# Patient Record
Sex: Female | Born: 1960
Health system: Southern US, Community
[De-identification: ages and names within clinical notes are randomized; demographics above are authoritative.]

---

## 1999-11-19 ENCOUNTER — Other Ambulatory Visit: Admission: RE | Admit: 1999-11-19 | Discharge: 1999-11-19 | Payer: Self-pay | Admitting: *Deleted

## 1999-12-01 ENCOUNTER — Encounter: Payer: Self-pay | Admitting: *Deleted

## 1999-12-01 ENCOUNTER — Ambulatory Visit (HOSPITAL_COMMUNITY): Admission: RE | Admit: 1999-12-01 | Discharge: 1999-12-01 | Payer: Self-pay | Admitting: *Deleted

## 2001-02-22 ENCOUNTER — Other Ambulatory Visit: Admission: RE | Admit: 2001-02-22 | Discharge: 2001-02-22 | Payer: Self-pay | Admitting: *Deleted

## 2004-07-30 ENCOUNTER — Ambulatory Visit (HOSPITAL_COMMUNITY): Admission: RE | Admit: 2004-07-30 | Discharge: 2004-07-30 | Payer: Self-pay | Admitting: Family Medicine

## 2004-08-10 ENCOUNTER — Encounter: Admission: RE | Admit: 2004-08-10 | Discharge: 2004-08-10 | Payer: Self-pay | Admitting: Family Medicine

## 2005-10-21 ENCOUNTER — Encounter: Admission: RE | Admit: 2005-10-21 | Discharge: 2005-10-21 | Payer: Self-pay | Admitting: Family Medicine

## 2005-10-31 ENCOUNTER — Encounter: Admission: RE | Admit: 2005-10-31 | Discharge: 2005-10-31 | Payer: Self-pay | Admitting: Family Medicine

## 2006-09-04 ENCOUNTER — Other Ambulatory Visit: Admission: RE | Admit: 2006-09-04 | Discharge: 2006-09-04 | Payer: Self-pay | Admitting: Family Medicine

## 2006-11-23 ENCOUNTER — Encounter: Admission: RE | Admit: 2006-11-23 | Discharge: 2006-11-23 | Payer: Self-pay | Admitting: Family Medicine

## 2008-01-04 ENCOUNTER — Encounter: Admission: RE | Admit: 2008-01-04 | Discharge: 2008-01-04 | Payer: Self-pay | Admitting: Family Medicine

## 2009-01-16 ENCOUNTER — Encounter: Admission: RE | Admit: 2009-01-16 | Discharge: 2009-01-16 | Payer: Self-pay | Admitting: Family Medicine

## 2010-04-13 ENCOUNTER — Other Ambulatory Visit: Payer: Self-pay | Admitting: Family Medicine

## 2010-04-13 DIAGNOSIS — Z1231 Encounter for screening mammogram for malignant neoplasm of breast: Secondary | ICD-10-CM

## 2010-04-13 DIAGNOSIS — Z1239 Encounter for other screening for malignant neoplasm of breast: Secondary | ICD-10-CM

## 2010-04-21 ENCOUNTER — Ambulatory Visit
Admission: RE | Admit: 2010-04-21 | Discharge: 2010-04-21 | Disposition: A | Discharge status: Home / Self Care | Payer: BC Managed Care – PPO | Source: Ambulatory Visit | Attending: Family Medicine | Admitting: Family Medicine

## 2010-04-21 DIAGNOSIS — Z1231 Encounter for screening mammogram for malignant neoplasm of breast: Secondary | ICD-10-CM

## 2011-04-12 ENCOUNTER — Other Ambulatory Visit: Payer: Self-pay | Admitting: Family Medicine

## 2011-04-12 DIAGNOSIS — Z1231 Encounter for screening mammogram for malignant neoplasm of breast: Secondary | ICD-10-CM

## 2011-04-26 ENCOUNTER — Ambulatory Visit
Admission: RE | Admit: 2011-04-26 | Discharge: 2011-04-26 | Disposition: A | Discharge status: Home / Self Care | Payer: 59 | Source: Ambulatory Visit | Attending: Family Medicine | Admitting: Family Medicine

## 2011-04-26 DIAGNOSIS — Z1231 Encounter for screening mammogram for malignant neoplasm of breast: Secondary | ICD-10-CM

## 2011-05-24 ENCOUNTER — Other Ambulatory Visit (HOSPITAL_COMMUNITY)
Admission: RE | Admit: 2011-05-24 | Discharge: 2011-05-24 | Disposition: A | Discharge status: Home / Self Care | Payer: 59 | Source: Ambulatory Visit | Attending: Family Medicine | Admitting: Family Medicine

## 2011-05-24 ENCOUNTER — Other Ambulatory Visit: Payer: Self-pay | Admitting: Physician Assistant

## 2011-05-24 DIAGNOSIS — Z124 Encounter for screening for malignant neoplasm of cervix: Secondary | ICD-10-CM | POA: Insufficient documentation

## 2012-05-28 ENCOUNTER — Other Ambulatory Visit: Payer: Self-pay | Admitting: Physician Assistant

## 2012-05-28 ENCOUNTER — Other Ambulatory Visit (HOSPITAL_COMMUNITY)
Admission: RE | Admit: 2012-05-28 | Discharge: 2012-05-28 | Disposition: A | Discharge status: Home / Self Care | Payer: 59 | Source: Ambulatory Visit | Attending: Family Medicine | Admitting: Family Medicine

## 2012-05-28 DIAGNOSIS — Z124 Encounter for screening for malignant neoplasm of cervix: Secondary | ICD-10-CM | POA: Insufficient documentation

## 2012-06-04 ENCOUNTER — Other Ambulatory Visit: Payer: Self-pay

## 2012-06-04 DIAGNOSIS — Z1231 Encounter for screening mammogram for malignant neoplasm of breast: Secondary | ICD-10-CM

## 2012-07-02 ENCOUNTER — Other Ambulatory Visit: Payer: Self-pay | Admitting: Family Medicine

## 2012-07-02 ENCOUNTER — Ambulatory Visit
Admission: RE | Admit: 2012-07-02 | Discharge: 2012-07-02 | Disposition: A | Discharge status: Home / Self Care | Payer: 59 | Source: Ambulatory Visit

## 2012-07-02 DIAGNOSIS — R928 Other abnormal and inconclusive findings on diagnostic imaging of breast: Secondary | ICD-10-CM

## 2012-07-02 DIAGNOSIS — Z1231 Encounter for screening mammogram for malignant neoplasm of breast: Secondary | ICD-10-CM

## 2012-07-13 ENCOUNTER — Ambulatory Visit
Admission: RE | Admit: 2012-07-13 | Discharge: 2012-07-13 | Disposition: A | Discharge status: Home / Self Care | Payer: 59 | Source: Ambulatory Visit | Attending: Family Medicine | Admitting: Family Medicine

## 2012-07-13 DIAGNOSIS — R928 Other abnormal and inconclusive findings on diagnostic imaging of breast: Secondary | ICD-10-CM

## 2012-12-24 ENCOUNTER — Other Ambulatory Visit: Payer: Self-pay | Admitting: Family Medicine

## 2012-12-24 DIAGNOSIS — N6310 Unspecified lump in the right breast, unspecified quadrant: Secondary | ICD-10-CM

## 2013-01-14 ENCOUNTER — Ambulatory Visit
Admission: RE | Admit: 2013-01-14 | Discharge: 2013-01-14 | Disposition: A | Discharge status: Home / Self Care | Payer: 59 | Source: Ambulatory Visit | Attending: Family Medicine | Admitting: Family Medicine

## 2013-01-14 DIAGNOSIS — N6310 Unspecified lump in the right breast, unspecified quadrant: Secondary | ICD-10-CM

## 2013-05-29 ENCOUNTER — Other Ambulatory Visit: Payer: Self-pay | Admitting: Physician Assistant

## 2013-05-29 ENCOUNTER — Other Ambulatory Visit (HOSPITAL_COMMUNITY)
Admission: RE | Admit: 2013-05-29 | Discharge: 2013-05-29 | Disposition: A | Discharge status: Home / Self Care | Payer: 59 | Source: Ambulatory Visit | Attending: Family Medicine | Admitting: Family Medicine

## 2013-05-29 DIAGNOSIS — Z124 Encounter for screening for malignant neoplasm of cervix: Secondary | ICD-10-CM | POA: Insufficient documentation

## 2013-08-15 ENCOUNTER — Other Ambulatory Visit: Payer: Self-pay | Admitting: Family Medicine

## 2013-08-15 DIAGNOSIS — N6489 Other specified disorders of breast: Secondary | ICD-10-CM

## 2013-08-27 ENCOUNTER — Ambulatory Visit
Admission: RE | Admit: 2013-08-27 | Discharge: 2013-08-27 | Disposition: A | Discharge status: Home / Self Care | Payer: 59 | Source: Ambulatory Visit | Attending: Family Medicine | Admitting: Family Medicine

## 2013-08-27 ENCOUNTER — Encounter (INDEPENDENT_AMBULATORY_CARE_PROVIDER_SITE_OTHER): Payer: Self-pay

## 2013-08-27 DIAGNOSIS — N6489 Other specified disorders of breast: Secondary | ICD-10-CM

## 2015-01-29 ENCOUNTER — Other Ambulatory Visit: Payer: Self-pay

## 2015-01-29 DIAGNOSIS — Z1231 Encounter for screening mammogram for malignant neoplasm of breast: Secondary | ICD-10-CM

## 2015-03-05 ENCOUNTER — Ambulatory Visit
Admission: RE | Admit: 2015-03-05 | Discharge: 2015-03-05 | Disposition: A | Discharge status: Home / Self Care | Payer: 59 | Source: Ambulatory Visit

## 2015-03-05 DIAGNOSIS — Z1231 Encounter for screening mammogram for malignant neoplasm of breast: Secondary | ICD-10-CM

## 2015-03-11 ENCOUNTER — Other Ambulatory Visit: Payer: Self-pay | Admitting: Family Medicine

## 2015-03-11 DIAGNOSIS — R928 Other abnormal and inconclusive findings on diagnostic imaging of breast: Secondary | ICD-10-CM

## 2015-03-18 ENCOUNTER — Ambulatory Visit
Admission: RE | Admit: 2015-03-18 | Discharge: 2015-03-18 | Disposition: A | Discharge status: Home / Self Care | Payer: 59 | Source: Ambulatory Visit | Attending: Family Medicine | Admitting: Family Medicine

## 2015-03-18 ENCOUNTER — Other Ambulatory Visit: Payer: Self-pay | Admitting: Family Medicine

## 2015-03-18 DIAGNOSIS — R928 Other abnormal and inconclusive findings on diagnostic imaging of breast: Secondary | ICD-10-CM

## 2015-06-03 ENCOUNTER — Other Ambulatory Visit (HOSPITAL_COMMUNITY)
Admission: RE | Admit: 2015-06-03 | Discharge: 2015-06-03 | Disposition: A | Discharge status: Home / Self Care | Payer: 59 | Source: Ambulatory Visit | Attending: Family Medicine | Admitting: Family Medicine

## 2015-06-03 ENCOUNTER — Other Ambulatory Visit: Payer: Self-pay | Admitting: Physician Assistant

## 2015-06-03 DIAGNOSIS — Z124 Encounter for screening for malignant neoplasm of cervix: Secondary | ICD-10-CM | POA: Insufficient documentation

## 2015-06-04 LAB — CYTOLOGY - PAP

## 2016-04-21 ENCOUNTER — Other Ambulatory Visit: Payer: Self-pay | Admitting: Physician Assistant

## 2016-04-21 DIAGNOSIS — Z1231 Encounter for screening mammogram for malignant neoplasm of breast: Secondary | ICD-10-CM

## 2016-04-28 ENCOUNTER — Ambulatory Visit
Admission: RE | Admit: 2016-04-28 | Discharge: 2016-04-28 | Disposition: A | Discharge status: Home / Self Care | Payer: 59 | Source: Ambulatory Visit | Attending: Physician Assistant | Admitting: Physician Assistant

## 2016-04-28 DIAGNOSIS — Z1231 Encounter for screening mammogram for malignant neoplasm of breast: Secondary | ICD-10-CM | POA: Diagnosis not present

## 2016-06-08 DIAGNOSIS — Z23 Encounter for immunization: Secondary | ICD-10-CM | POA: Diagnosis not present

## 2016-06-08 DIAGNOSIS — Z0000 Encounter for general adult medical examination without abnormal findings: Secondary | ICD-10-CM | POA: Diagnosis not present

## 2017-05-05 ENCOUNTER — Other Ambulatory Visit: Payer: Self-pay | Admitting: Physician Assistant

## 2017-05-05 DIAGNOSIS — Z139 Encounter for screening, unspecified: Secondary | ICD-10-CM

## 2017-05-24 ENCOUNTER — Ambulatory Visit
Admission: RE | Admit: 2017-05-24 | Discharge: 2017-05-24 | Disposition: A | Discharge status: Home / Self Care | Payer: 59 | Source: Ambulatory Visit | Attending: Physician Assistant | Admitting: Physician Assistant

## 2017-05-24 DIAGNOSIS — Z1231 Encounter for screening mammogram for malignant neoplasm of breast: Secondary | ICD-10-CM | POA: Diagnosis not present

## 2017-05-24 DIAGNOSIS — Z139 Encounter for screening, unspecified: Secondary | ICD-10-CM

## 2017-06-12 DIAGNOSIS — Z0000 Encounter for general adult medical examination without abnormal findings: Secondary | ICD-10-CM | POA: Diagnosis not present

## 2017-06-12 DIAGNOSIS — Z13220 Encounter for screening for lipoid disorders: Secondary | ICD-10-CM | POA: Diagnosis not present

## 2019-05-15 ENCOUNTER — Other Ambulatory Visit: Payer: Self-pay | Admitting: Physician Assistant

## 2019-05-15 ENCOUNTER — Other Ambulatory Visit (HOSPITAL_COMMUNITY)
Admission: RE | Admit: 2019-05-15 | Discharge: 2019-05-15 | Disposition: A | Discharge status: Home / Self Care | Payer: 59 | Source: Ambulatory Visit | Attending: Physician Assistant | Admitting: Physician Assistant

## 2019-05-15 DIAGNOSIS — Z124 Encounter for screening for malignant neoplasm of cervix: Secondary | ICD-10-CM | POA: Insufficient documentation

## 2019-05-16 LAB — CYTOLOGY - PAP: Diagnosis: NEGATIVE

## 2020-03-23 ENCOUNTER — Other Ambulatory Visit: Payer: Self-pay | Admitting: Physician Assistant

## 2020-03-23 DIAGNOSIS — Z1231 Encounter for screening mammogram for malignant neoplasm of breast: Secondary | ICD-10-CM

## 2020-03-25 ENCOUNTER — Other Ambulatory Visit: Payer: Self-pay

## 2020-03-25 ENCOUNTER — Ambulatory Visit
Admission: RE | Admit: 2020-03-25 | Discharge: 2020-03-25 | Disposition: A | Discharge status: Home / Self Care | Payer: 59 | Source: Ambulatory Visit | Attending: Physician Assistant | Admitting: Physician Assistant

## 2020-03-25 DIAGNOSIS — Z1231 Encounter for screening mammogram for malignant neoplasm of breast: Secondary | ICD-10-CM

## 2020-06-01 ENCOUNTER — Encounter: Payer: Self-pay | Admitting: Gastroenterology

## 2020-07-21 ENCOUNTER — Encounter: Payer: 59 | Admitting: Gastroenterology

## 2021-04-30 ENCOUNTER — Other Ambulatory Visit: Payer: Self-pay | Admitting: Physician Assistant

## 2021-04-30 DIAGNOSIS — Z1231 Encounter for screening mammogram for malignant neoplasm of breast: Secondary | ICD-10-CM

## 2021-05-03 ENCOUNTER — Ambulatory Visit
Admission: RE | Admit: 2021-05-03 | Discharge: 2021-05-03 | Disposition: A | Discharge status: Home / Self Care | Payer: 59 | Source: Ambulatory Visit | Attending: Physician Assistant | Admitting: Physician Assistant

## 2021-05-03 DIAGNOSIS — Z1231 Encounter for screening mammogram for malignant neoplasm of breast: Secondary | ICD-10-CM

## 2021-05-05 ENCOUNTER — Other Ambulatory Visit: Payer: Self-pay | Admitting: Physician Assistant

## 2021-05-05 DIAGNOSIS — R928 Other abnormal and inconclusive findings on diagnostic imaging of breast: Secondary | ICD-10-CM

## 2021-05-17 ENCOUNTER — Ambulatory Visit
Admission: RE | Admit: 2021-05-17 | Discharge: 2021-05-17 | Disposition: A | Discharge status: Home / Self Care | Payer: 59 | Source: Ambulatory Visit | Attending: Physician Assistant | Admitting: Physician Assistant

## 2021-05-17 ENCOUNTER — Ambulatory Visit: Payer: 59

## 2021-05-17 DIAGNOSIS — R928 Other abnormal and inconclusive findings on diagnostic imaging of breast: Secondary | ICD-10-CM

## 2022-01-25 ENCOUNTER — Ambulatory Visit
Admission: RE | Admit: 2022-01-25 | Discharge: 2022-01-25 | Disposition: A | Discharge status: Home / Self Care | Payer: 59 | Source: Ambulatory Visit | Attending: Physician Assistant | Admitting: Physician Assistant

## 2022-01-25 ENCOUNTER — Other Ambulatory Visit: Payer: Self-pay | Admitting: Physician Assistant

## 2022-01-25 DIAGNOSIS — S6992XA Unspecified injury of left wrist, hand and finger(s), initial encounter: Secondary | ICD-10-CM

## 2022-03-31 ENCOUNTER — Other Ambulatory Visit: Payer: Self-pay | Admitting: Physician Assistant

## 2022-03-31 DIAGNOSIS — Z1231 Encounter for screening mammogram for malignant neoplasm of breast: Secondary | ICD-10-CM

## 2022-05-19 ENCOUNTER — Ambulatory Visit
Admission: RE | Admit: 2022-05-19 | Discharge: 2022-05-19 | Disposition: A | Discharge status: Home / Self Care | Payer: 59 | Source: Ambulatory Visit | Attending: Physician Assistant | Admitting: Physician Assistant

## 2022-05-19 DIAGNOSIS — Z1231 Encounter for screening mammogram for malignant neoplasm of breast: Secondary | ICD-10-CM

## 2022-12-12 IMAGING — MG MM DIGITAL DIAGNOSTIC UNILAT*R* W/ TOMO W/ CAD
4 series · 4 of 12 positions shown · non-contrast
Comparison: Previous exam(s).

CLINICAL DATA: Screening recall for a possible right breast
architectural distortion.

EXAM:
DIGITAL DIAGNOSTIC UNILATERAL RIGHT MAMMOGRAM WITH TOMOSYNTHESIS AND
CAD
TECHNIQUE: Right digital diagnostic mammography and breast tomosynthesis was
performed. The images were evaluated with computer-aided detection.

[R ML synth-2D]
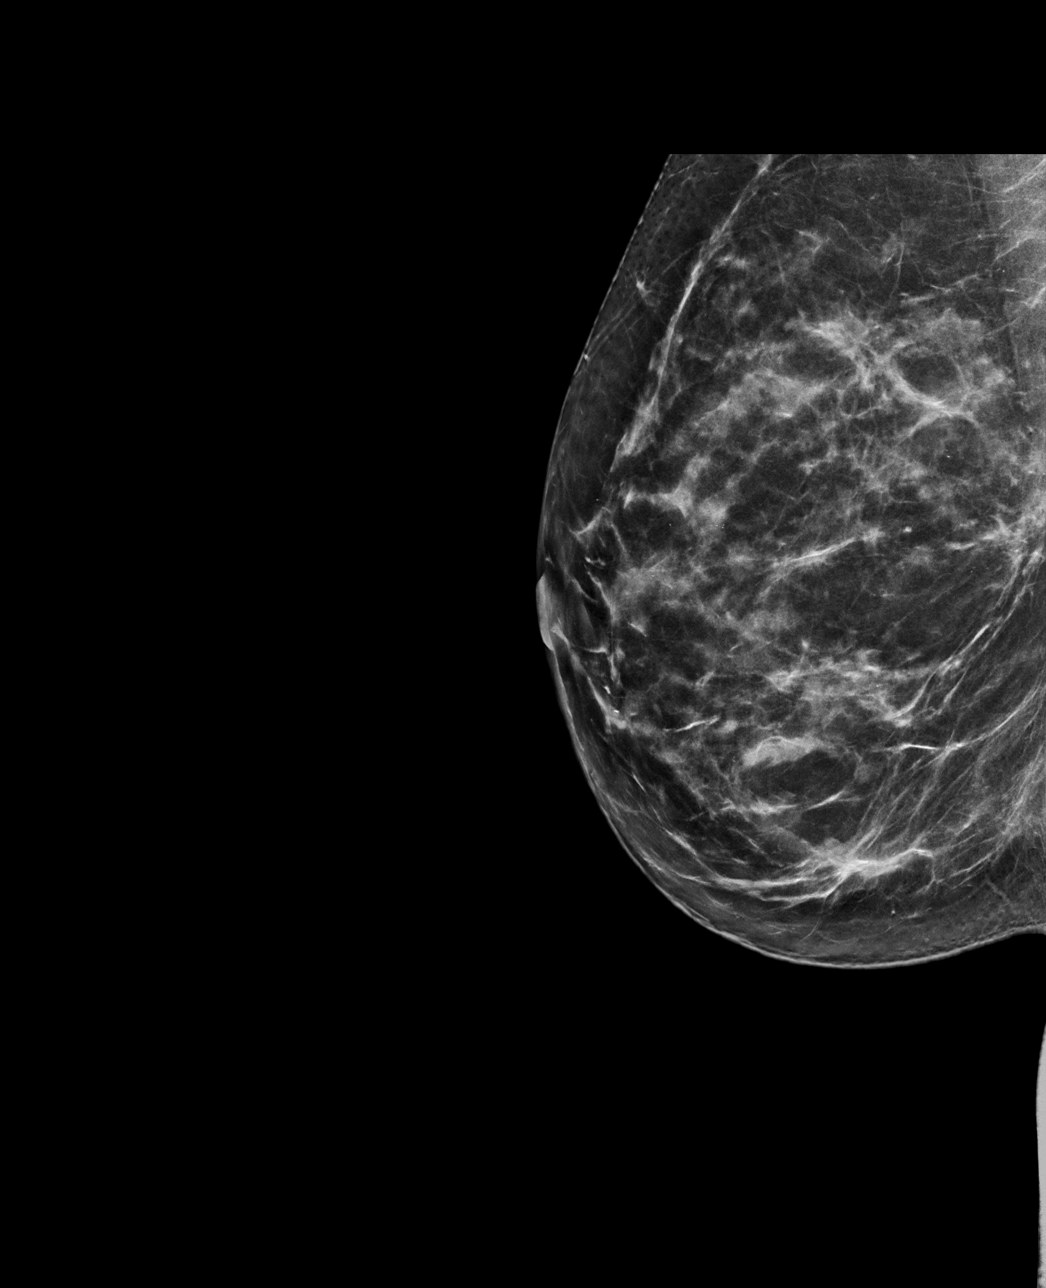

[R MLO synth-2D]
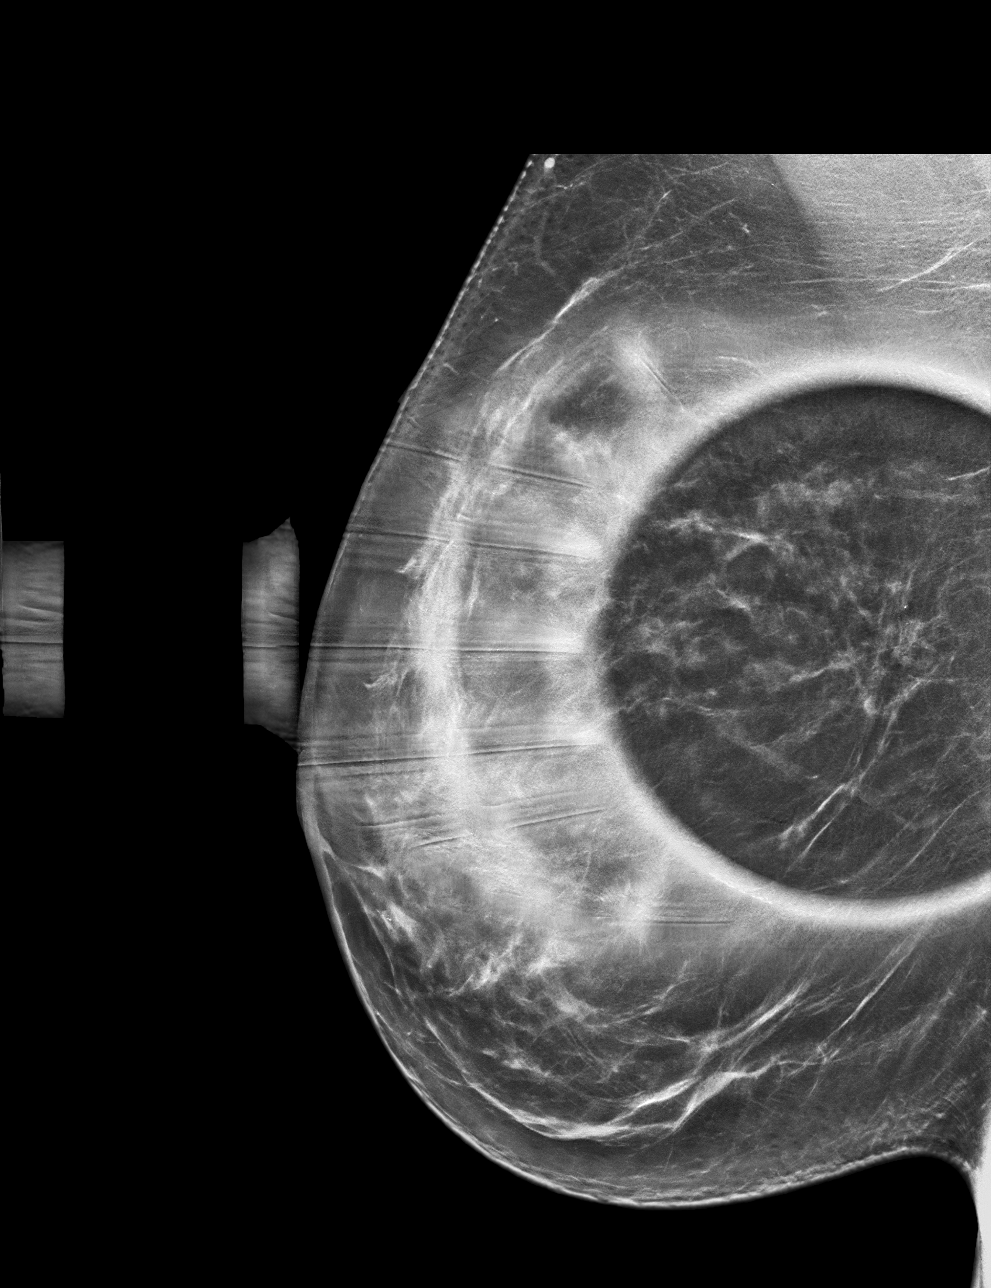

[R ML tomo · tomo slice 35/70.0]
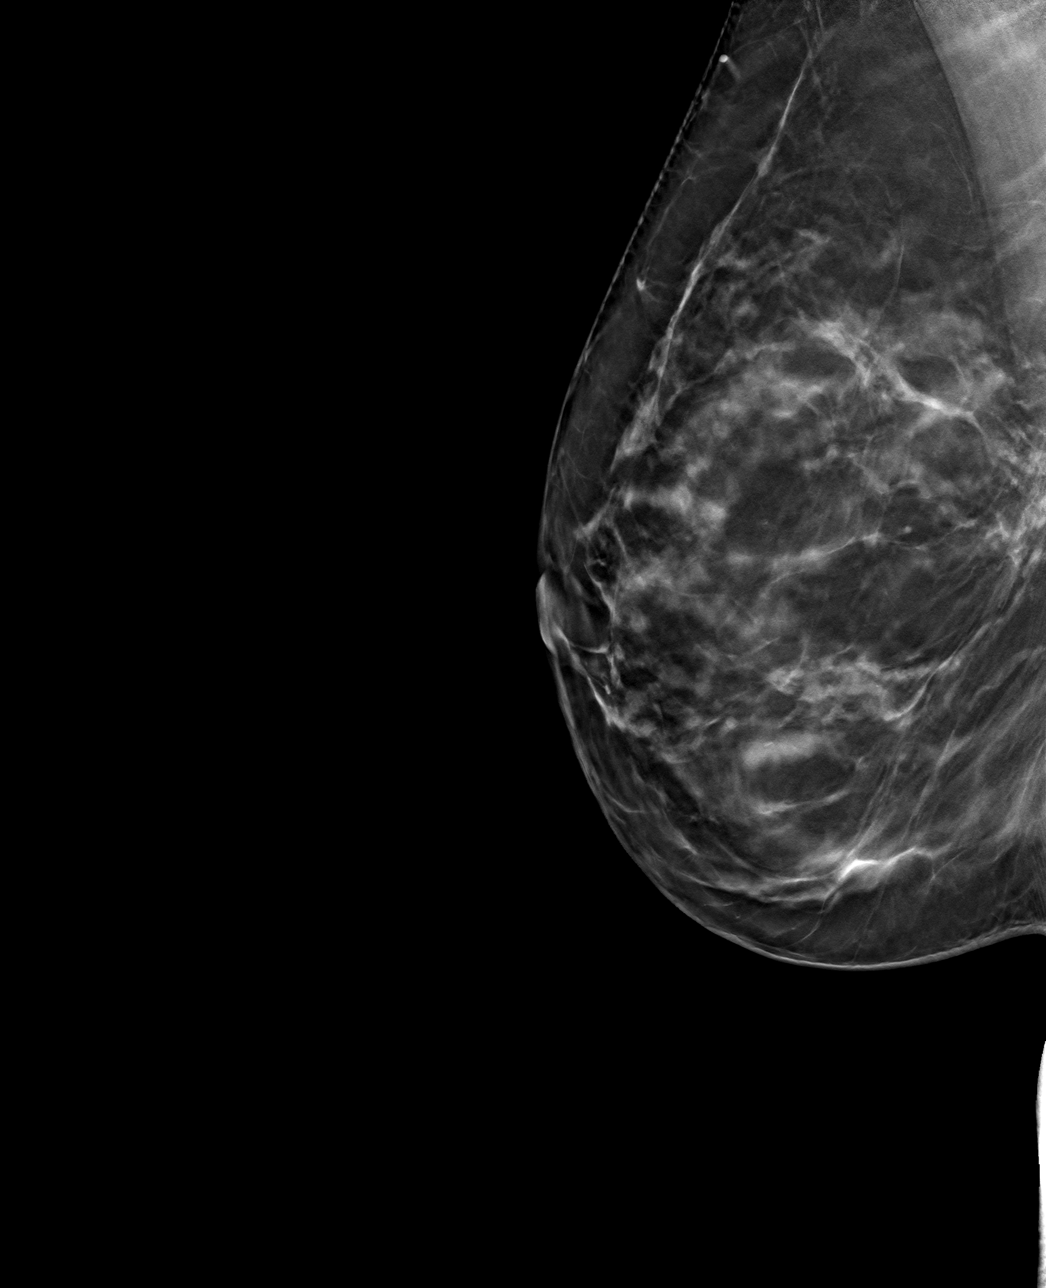

[R MLO tomo · tomo slice 35/69.0]
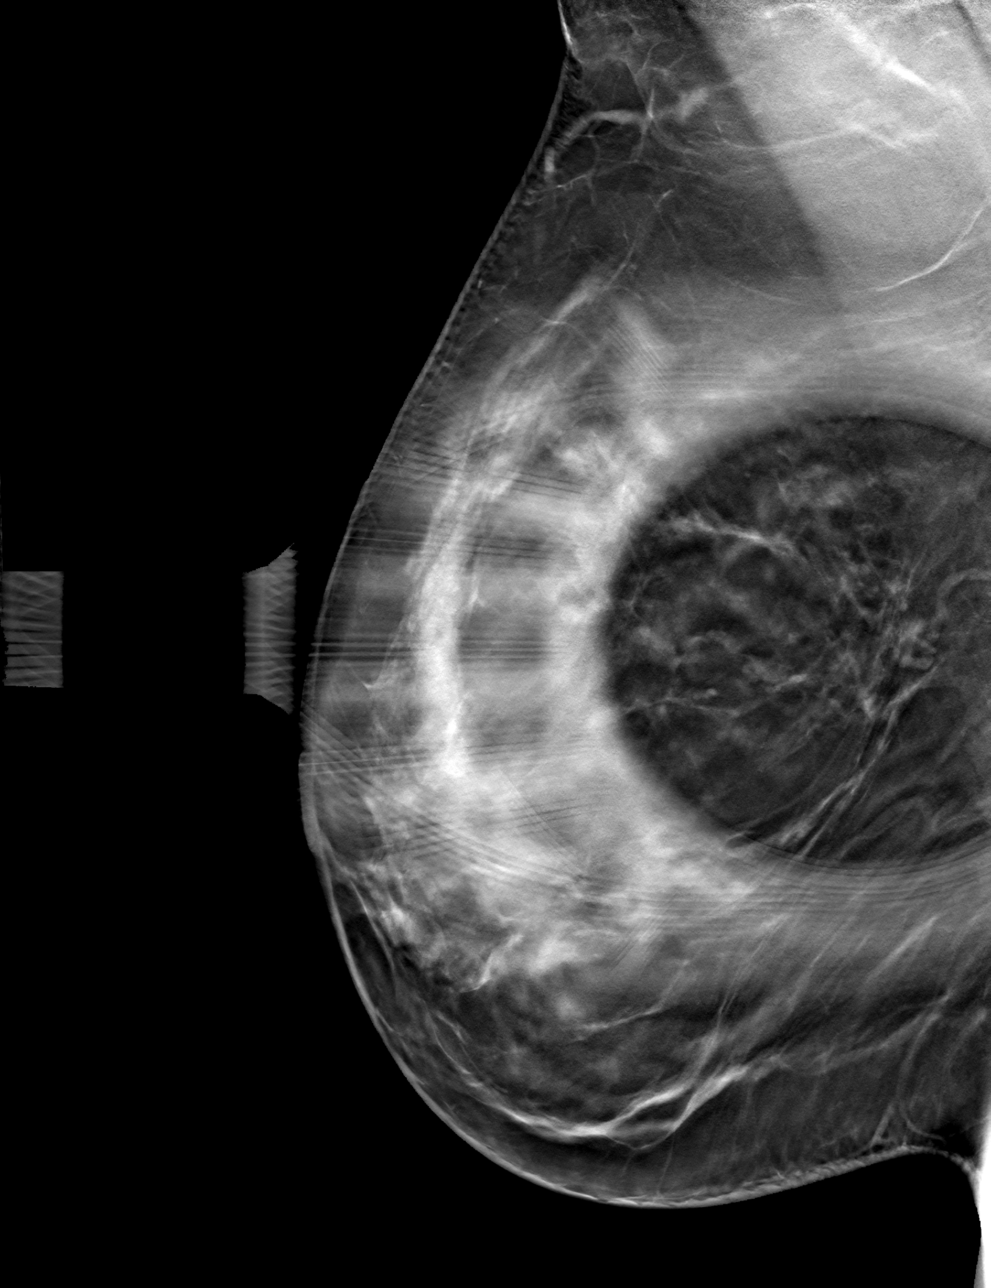

[4 of 12 positions shown; findings below may reference images not displayed]

ACR Breast Density Category c: The breast tissue is heterogeneously
dense, which may obscure small masses.
FINDINGS: The possible architectural distortion, seen in the posterior right
breast on the screening MLO view, partly disperses on spot
compression imaging consistent with fibroglandular tissue. The
appearance on spot compression imaging is also similar to multiple
prior exams. There is no true architectural distortion and no
underlying mass. No suspicious calcifications.
IMPRESSION: No evidence of breast malignancy.

RECOMMENDATION:
Screening mammogram in one year.(Code:OG-[id number])

I have discussed the findings and recommendations with the patient.
If applicable, a reminder letter will be sent to the patient
regarding the next appointment.

BI-RADS CATEGORY  1: Negative.

## 2023-05-26 DIAGNOSIS — E7800 Pure hypercholesterolemia, unspecified: Secondary | ICD-10-CM | POA: Diagnosis not present

## 2023-05-26 DIAGNOSIS — Z0000 Encounter for general adult medical examination without abnormal findings: Secondary | ICD-10-CM | POA: Diagnosis not present
